# Patient Record
Sex: Female | Born: 1995
Health system: Southern US, Community
[De-identification: ages and names within clinical notes are randomized; demographics above are authoritative.]

## PROBLEM LIST (undated history)

## (undated) DIAGNOSIS — F329 Major depressive disorder, single episode, unspecified: Secondary | ICD-10-CM

## (undated) DIAGNOSIS — Z789 Other specified health status: Secondary | ICD-10-CM

## (undated) DIAGNOSIS — F419 Anxiety disorder, unspecified: Secondary | ICD-10-CM

## (undated) DIAGNOSIS — F32A Depression, unspecified: Secondary | ICD-10-CM

## (undated) HISTORY — PX: ANKLE SURGERY: SHX546

## (undated) HISTORY — PX: ANKLE FRACTURE SURGERY: SHX122

## (undated) HISTORY — PX: PILONIDAL CYST EXCISION: SHX744

---
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
## 2017-08-27 ENCOUNTER — Emergency Department (HOSPITAL_COMMUNITY)
Admission: EM | Admit: 2017-08-27 | Discharge: 2017-08-27 | Disposition: A | Discharge status: Home / Self Care | Payer: BLUE CROSS/BLUE SHIELD | Attending: Emergency Medicine | Admitting: Emergency Medicine

## 2017-08-27 ENCOUNTER — Encounter (HOSPITAL_COMMUNITY): Payer: Self-pay | Admitting: Emergency Medicine

## 2017-08-27 DIAGNOSIS — N760 Acute vaginitis: Secondary | ICD-10-CM | POA: Insufficient documentation

## 2017-08-27 DIAGNOSIS — Z206 Contact with and (suspected) exposure to human immunodeficiency virus [HIV]: Secondary | ICD-10-CM

## 2017-08-27 DIAGNOSIS — B9689 Other specified bacterial agents as the cause of diseases classified elsewhere: Secondary | ICD-10-CM

## 2017-08-27 LAB — COMPREHENSIVE METABOLIC PANEL
ALBUMIN: 4.3 g/dL (ref 3.5–5.0)
ALT: 12 U/L — ABNORMAL LOW (ref 14–54)
AST: 16 U/L (ref 15–41)
Alkaline Phosphatase: 74 U/L (ref 38–126)
Anion gap: 10 (ref 5–15)
BILIRUBIN TOTAL: 0.7 mg/dL (ref 0.3–1.2)
BUN: 10 mg/dL (ref 6–20)
CO2: 23 mmol/L (ref 22–32)
Calcium: 9.5 mg/dL (ref 8.9–10.3)
Chloride: 107 mmol/L (ref 101–111)
Creatinine, Ser: 0.69 mg/dL (ref 0.44–1.00)
GFR calc Af Amer: >60 mL/min (ref >60)
GLUCOSE: 92 mg/dL (ref 65–99)
POTASSIUM: 3.8 mmol/L (ref 3.5–5.1)
Sodium: 140 mmol/L (ref 135–145)
TOTAL PROTEIN: 8.6 g/dL — AB (ref 6.5–8.1)

## 2017-08-27 LAB — POC URINE PREG, ED: Preg Test, Ur: NEGATIVE

## 2017-08-27 LAB — WET PREP, GENITAL
Sperm: NONE SEEN
Trich, Wet Prep: NONE SEEN
YEAST WET PREP: NONE SEEN

## 2017-08-27 LAB — RAPID HIV SCREEN (HIV 1/2 AB+AG)
HIV 1/2 ANTIBODIES: NONREACTIVE
HIV-1 P24 Antigen - HIV24: NONREACTIVE

## 2017-08-27 MED ORDER — ELVITEG-COBIC-EMTRICIT-TENOFAF 150-150-200-10 MG PO TABS: 1.0000 | ORAL_TABLET | Freq: Every day | ORAL | 0 refills | Status: DC

## 2017-08-27 MED ORDER — ELVITEG-COBIC-EMTRICIT-TENOFAF 150-150-200-10 MG PO TABS: 1.0000 | ORAL_TABLET | Freq: Every day | ORAL | 0 refills | Status: AC

## 2017-08-27 MED ORDER — METRONIDAZOLE 500 MG PO TABS: 500.0000 mg | ORAL_TABLET | Freq: Two times a day (BID) | ORAL | 0 refills | Status: AC

## 2017-08-27 MED FILL — GENVOYA TABLET: 150-150-200 | 28 days supply | Qty: 28 | Fill #0

## 2017-08-27 MED FILL — metroNIDAZOLE 500 MG TABS: 500 | 7 days supply | Qty: 14 | Fill #0

## 2017-08-27 NOTE — ED Notes (Signed)
Non-reactive HIV results.

## 2017-08-27 NOTE — Care Management Note (Signed)
Case Management Note  CM consulted for the [facility] [employer] Program for assistance with [facility] co-pay cost.  CM completed enrollment for with pt and Dr. Adriana Simas.  Form faxed to [facility] and confirmed receipt.  Pt also has contact information for further questions.  Pt to pick up 5 day dose of prescription at the [facility] pharmacy and wait to hear from [employer] before picking up the next 23 doses.  No further CM needs noted at this time.

## 2017-08-27 NOTE — ED Triage Notes (Signed)
Pt reports that she was told that she been sexually active with someone that has HIV. Denies any complaints at this time. Just wants "check Up".

## 2017-08-27 NOTE — Discharge Instructions (Addendum)
Prescription for antiviral medicine and antibiotic for bacterial vaginosis.  [facility] or [facility] department.  Case management will contact you regarding follow-up labs.

## 2017-08-28 LAB — HIV ANTIBODY (ROUTINE TESTING W REFLEX): HIV Screen 4th Generation wRfx: NONREACTIVE

## 2017-08-28 LAB — HEPATITIS C ANTIBODY

## 2017-08-28 LAB — RPR: RPR Ser Ql: NONREACTIVE

## 2017-08-28 LAB — HEPATITIS B SURFACE ANTIGEN: Hepatitis B Surface Ag: NEGATIVE

## 2017-08-31 LAB — GC/CHLAMYDIA PROBE AMP (~~LOC~~) NOT AT ARMC
CHLAMYDIA, DNA PROBE: POSITIVE — AB
NEISSERIA GONORRHEA: NEGATIVE

## 2017-09-01 NOTE — ED Provider Notes (Addendum)
[facility]-EMERGENCY DEPT Provider Note   CSN: [id number] Arrival date & time: 08/27/17  0930     History   Chief Complaint Chief Complaint  Patient presents with  . Exposure to STD    HPI Brittany Martinez is a 22 y.o. female.  Patient reports being sexually active with an HIV-positive individual.  She had 2 exposures in the past week.  She wants to be "checked out".  No specific complaints of vaginal discharge, vaginal bleeding, concern for pregnancy.  She is normally healthy.     History reviewed. No pertinent past medical history.  There are no active problems to display for this patient.   Past Surgical History:  Procedure Laterality Date  . ANKLE FRACTURE SURGERY    . PILONIDAL CYST EXCISION       OB History   None      Home Medications    Prior to Admission medications   Medication Sig Start Date End Date Taking? Authorizing Provider  elvitegravir-cobicistat-emtricitabine-tenofovir (GENVOYA) 150-150-200-10 MG TABS tablet Take 1 tablet by mouth daily with breakfast. 08/27/17   Donnetta Hutching, MD  elvitegravir-cobicistat-emtricitabine-tenofovir (GENVOYA) 150-150-200-10 MG TABS tablet Take 1 tablet by mouth daily with breakfast. 08/27/17   Donnetta Hutching, MD  metroNIDAZOLE (FLAGYL) 500 MG tablet Take 1 tablet (500 mg total) by mouth 2 (two) times daily. 08/27/17   Donnetta Hutching, MD    Family History No family history on file.  Social History Social History   Tobacco Use  . Smoking status: Never Smoker  . Smokeless tobacco: Never Used  Substance Use Topics  . Alcohol use: No  . Drug use: No     Allergies   Patient has no known allergies.   Review of Systems Review of Systems  All other systems reviewed and are negative.    Physical Exam Updated Vital Signs BP (!) 141/91 (BP Location: Right Arm)   Pulse 71   Temp 98.9 F (37.2 C) (Oral)   Resp 14   Ht  (1.803 m)   Wt 108.9 kg (240 lb)   LMP 08/17/2017   SpO2 100%    BMI 33.47 kg/m   Physical Exam  Constitutional: She is oriented to person, place, and time. She appears well-developed and well-nourished.  HENT:  Head: Normocephalic and atraumatic.  Eyes: Conjunctivae are normal.  Neck: Neck supple.  Cardiovascular: Normal rate and regular rhythm.  Pulmonary/Chest: Effort normal and breath sounds normal.  Abdominal: Soft. Bowel sounds are normal.  Genitourinary:  Genitourinary Comments: Genital exam: Normal external genitalia.  Cervix/adnexa nontender.  White paracervical discharge.  Musculoskeletal: Normal range of motion.  Neurological: She is alert and oriented to person, place, and time.  Skin: Skin is warm and dry.  Psychiatric: She has a normal mood and affect. Her behavior is normal.  Nursing note and vitals reviewed.    ED Treatments / Results  Labs (all labs ordered are listed, but only abnormal results are displayed) Labs Reviewed  WET PREP, GENITAL - Abnormal; Notable for the following components:      Result Value   Clue Cells Wet Prep HPF POC PRESENT (*)    WBC, Wet Prep HPF POC MANY (*)    All other components within normal limits  COMPREHENSIVE METABOLIC PANEL - Abnormal; Notable for the following components:   Total Protein 8.6 (*)    ALT 12 (*)    All other components within normal limits  GC/CHLAMYDIA PROBE AMP ([place]) NOT AT [facility] - Abnormal; Notable  for the following components:   Chlamydia **POSITIVE** (*)    All other components within normal limits  HIV ANTIBODY (ROUTINE TESTING)  RAPID HIV SCREEN (HIV 1/2 AB+AG)  HEPATITIS C ANTIBODY  HEPATITIS B SURFACE ANTIGEN  RPR  POC URINE PREG, ED    EKG None  Radiology No results found.  Procedures Procedures (including critical care time)  Medications Ordered in ED Medications - No data to display   Initial Impression / Assessment and Plan / ED Course  I have reviewed the triage vital signs and the nursing notes.  Pertinent labs & imaging results  that were available during my care of the patient were reviewed by me and considered in my medical decision making (see chart for details).     Patient presents with a possible HIV exposure.  I attempted to follow the post exposure protocol including physical exam, appropriate lab tests, social work screening for follow-up labs.  This was all discussed with the patient and her significant other.  Will Rx antivirals for HIV and metronidazole for bacterial vaginosis.  Final Clinical Impressions(s) / ED Diagnoses   Final diagnoses:  HIV exposure  Bacterial vaginosis    ED Discharge Orders        Ordered    elvitegravir-cobicistat-emtricitabine-tenofovir (GENVOYA) 150-150-200-10 MG TABS tablet  Daily with breakfast     08/27/17 1328    elvitegravir-cobicistat-emtricitabine-tenofovir (GENVOYA) 150-150-200-10 MG TABS tablet  Daily with breakfast     08/27/17 1328    metroNIDAZOLE (FLAGYL) 500 MG tablet  2 times daily     08/27/17 1515       Donnetta Hutching, MD 09/01/17 1306    Donnetta Hutching, MD 09/01/17 1306

## 2017-09-13 ENCOUNTER — Telehealth: Payer: Self-pay | Admitting: Pharmacist Clinician (PhC)/ Clinical Pharmacy Specialist

## 2017-09-13 NOTE — Telephone Encounter (Signed)
Brittany Martinez was recently seen in the ED on 5/24 for HIV exposure. HIV PEP was started on Genvoya. Need PEP>>>PrEP transition once she finishes PEP.

## 2017-09-20 ENCOUNTER — Telehealth: Payer: Self-pay | Admitting: Pharmacist Clinician (PhC)/ Clinical Pharmacy Specialist

## 2017-09-20 NOTE — Telephone Encounter (Signed)
Brittany Martinez was recently seen in the ED for a possible HIV positive partner. Apparently, her partner was recently tested positive through the health department? The story is a bit confusing. He has not heard back from the health department yet. ED started her on Genvoya for PEP. She was also tested positive for chlamydia in the ED and [facility] [place]. Schedule her to come in to see pharmacy next week to do PrEP. Hopefully, we can get more of the story.

## 2017-09-27 ENCOUNTER — Ambulatory Visit: Payer: BLUE CROSS/BLUE SHIELD

## 2017-11-29 ENCOUNTER — Emergency Department (HOSPITAL_COMMUNITY)
Admission: EM | Admit: 2017-11-29 | Discharge: 2017-11-29 | Disposition: A | Discharge status: Home / Self Care | Payer: BLUE CROSS/BLUE SHIELD | Attending: Emergency Medicine | Admitting: Emergency Medicine

## 2017-11-29 ENCOUNTER — Other Ambulatory Visit: Payer: Self-pay

## 2017-11-29 ENCOUNTER — Encounter (HOSPITAL_COMMUNITY): Payer: Self-pay

## 2017-11-29 DIAGNOSIS — Z202 Contact with and (suspected) exposure to infections with a predominantly sexual mode of transmission: Secondary | ICD-10-CM

## 2017-11-29 LAB — RAPID HIV SCREEN (HIV 1/2 AB+AG)
HIV 1/2 Antibodies: NONREACTIVE
HIV-1 P24 Antigen - HIV24: NONREACTIVE

## 2017-11-29 MED ORDER — ELVITEG-COBIC-EMTRICIT-TENOFAF 150-150-200-10 MG PO TABS: 1.0000 | ORAL_TABLET | Freq: Every day | ORAL | 0 refills | Status: AC

## 2017-11-29 NOTE — ED Provider Notes (Signed)
[facility]-EMERGENCY DEPT Provider Note   CSN: [id number] Arrival date & time: 11/29/17  1943     History   Chief Complaint No chief complaint on file.   HPI Brittany Martinez is a 22 y.o. female who presents to the ED for STD check. Patient reports she has been sexually active with a partner that has tested positive for HIV. Patient reports she was here 09/03/17 and thought then her partner may be HIV positive but his test came back negative and she stopped the HIV medication. Now patient reports that her partner was retested 11/09/17 and was HIV positive. Patient here requesting HIV screen. She states she can get all the other STD testing [facility]. Patient request starting back on medication since she had unprotected sex with the partner 3 day ago. Patient denies diarrhea, night sweats, weight loss or other symptoms.  HPI  History reviewed. No pertinent past medical history.  There are no active problems to display for this patient.   Past Surgical History:  Procedure Laterality Date  . ANKLE FRACTURE SURGERY    . PILONIDAL CYST EXCISION       OB History   None      Home Medications    Prior to Admission medications   Medication Sig Start Date End Date Taking? Authorizing Provider  elvitegravir-cobicistat-emtricitabine-tenofovir (GENVOYA) 150-150-200-10 MG TABS tablet Take 1 tablet by mouth daily with breakfast. 08/27/17   Donnetta Hutchingook, Brian, MD  elvitegravir-cobicistat-emtricitabine-tenofovir (GENVOYA) 150-150-200-10 MG TABS tablet Take 1 tablet by mouth daily with breakfast. 08/27/17   Donnetta Hutchingook, Brian, MD  metroNIDAZOLE (FLAGYL) 500 MG tablet Take 1 tablet (500 mg total) by mouth 2 (two) times daily. 08/27/17   Donnetta Hutchingook, Brian, MD    Family History No family history on file.  Social History Social History   Tobacco Use  . Smoking status: Never Smoker  . Smokeless tobacco: Never Used  Substance Use Topics  . Alcohol use: No  . Drug use: No      Allergies   Patient has no known allergies.   Review of Systems Review of Systems  All other systems reviewed and are negative. patient denies any symptoms  Physical Exam Updated Vital Signs BP 123/78 (BP Location: Left Arm)   Pulse 80   Temp 98.5 F (36.9 C) (Oral)   Resp 18   Ht 5\' 9"  (1.753 m)   Wt 97.1 kg   LMP 11/04/2017   SpO2 100%   BMI 31.60 kg/m   Physical Exam  Constitutional: She appears well-developed and well-nourished. No distress.  HENT:  Head: Normocephalic.  Eyes: EOM are normal.  Neck: Neck supple.  Cardiovascular: Normal rate.  Pulmonary/Chest: Effort normal.  Musculoskeletal: Normal range of motion.  Neurological: She is alert.  Skin: Skin is warm and dry.  Psychiatric: She has a normal mood and affect.  Nursing note and vitals reviewed.  I discussed this case with DR. Silverio LayYao.   ED Treatments / Results  Labs (all labs ordered are listed, but only abnormal results are displayed) Labs Reviewed  HIV ANTIBODY (ROUTINE TESTING)  RAPID HIV SCREEN (HIV 1/2 AB+AG)   Radiology No results found.  Procedures Procedures (including critical care time)  Medications Ordered in ED Medications - No data to display  Plan: if  HIV positive will consult ID and start Genvoya. If HIV negative start Genvoya and have patient follow up with her PCP or [facility] for continued screening.   Initial Impression / Assessment and Plan /  ED Course  I have reviewed the triage vital signs and the nursing notes. Care turned over to Shirlyn Goltz, [facility] @ 10:40 pm. Patient awaiting results Final Clinical Impressions(s) / ED Diagnoses   Final diagnoses:  STD exposure    ED Discharge Orders    None       Kerrie [place], NP 11/29/17 2242    Charlynne Pander, MD 11/29/17 [phone]

## 2017-11-29 NOTE — ED Triage Notes (Signed)
Pt is escorted with mother Pt wants to be tested for HIV, and states that her partner was tested and was positive. Pt denies any pain or symptoms.

## 2017-11-29 NOTE — Discharge Instructions (Signed)
Your HIV test was negative today.  This is not always accurate at the beginning of an HIV infection, so we would like you to start taking antiviral medication just incase this is an early infection.   Please take Genvoya daily.  Follow up with your regular doctor for recheck next week for further testing and to determine if you can be taken off the Genvoya.

## 2017-11-30 LAB — HIV ANTIBODY (ROUTINE TESTING W REFLEX): HIV Screen 4th Generation wRfx: NONREACTIVE

## 2018-09-03 ENCOUNTER — Encounter (HOSPITAL_COMMUNITY): Payer: Self-pay

## 2018-09-03 ENCOUNTER — Other Ambulatory Visit: Payer: Self-pay

## 2018-09-03 ENCOUNTER — Inpatient Hospital Stay (HOSPITAL_COMMUNITY)
Admission: AD | Admit: 2018-09-03 | Discharge: 2018-09-03 | Disposition: A | Discharge status: Home / Self Care | Payer: BLUE CROSS/BLUE SHIELD | Source: Ambulatory Visit | Attending: Obstetrics | Admitting: Obstetrics

## 2018-09-03 ENCOUNTER — Inpatient Hospital Stay (HOSPITAL_COMMUNITY): Payer: BLUE CROSS/BLUE SHIELD

## 2018-09-03 DIAGNOSIS — O039 Complete or unspecified spontaneous abortion without complication: Secondary | ICD-10-CM | POA: Diagnosis not present

## 2018-09-03 DIAGNOSIS — Z3A13 13 weeks gestation of pregnancy: Secondary | ICD-10-CM | POA: Diagnosis not present

## 2018-09-03 DIAGNOSIS — O26891 Other specified pregnancy related conditions, first trimester: Secondary | ICD-10-CM | POA: Diagnosis not present

## 2018-09-03 DIAGNOSIS — R109 Unspecified abdominal pain: Secondary | ICD-10-CM | POA: Diagnosis not present

## 2018-09-03 DIAGNOSIS — O209 Hemorrhage in early pregnancy, unspecified: Secondary | ICD-10-CM

## 2018-09-03 DIAGNOSIS — Z6790 Unspecified blood type, Rh positive: Secondary | ICD-10-CM

## 2018-09-03 HISTORY — DX: Other specified health status: Z78.9

## 2018-09-03 LAB — CBC WITH DIFFERENTIAL/PLATELET
Abs Immature Granulocytes: 0.02 10*3/uL (ref 0.00–0.07)
Basophils Absolute: 0 10*3/uL (ref 0.0–0.1)
Basophils Relative: 0 %
Eosinophils Absolute: 0.2 10*3/uL (ref 0.0–0.5)
Eosinophils Relative: 2 %
HCT: 35.4 % — ABNORMAL LOW (ref 36.0–46.0)
Hemoglobin: 11.9 g/dL — ABNORMAL LOW (ref 12.0–15.0)
Immature Granulocytes: 0 %
Lymphocytes Relative: 36 %
Lymphs Abs: 3.5 10*3/uL (ref 0.7–4.0)
MCH: 28.1 pg (ref 26.0–34.0)
MCHC: 33.6 g/dL (ref 30.0–36.0)
MCV: 83.5 fL (ref 80.0–100.0)
Monocytes Absolute: 0.6 10*3/uL (ref 0.1–1.0)
Monocytes Relative: 6 %
Neutro Abs: 5.3 10*3/uL (ref 1.7–7.7)
Neutrophils Relative %: 56 %
Platelets: 219 10*3/uL (ref 150–400)
RBC: 4.24 MIL/uL (ref 3.87–5.11)
RDW: 15.1 % (ref 11.5–15.5)
WBC: 9.7 10*3/uL (ref 4.0–10.5)
nRBC: 0 % (ref 0.0–0.2)

## 2018-09-03 LAB — URINALYSIS, ROUTINE W REFLEX MICROSCOPIC
Bacteria, UA: NONE SEEN
Bilirubin Urine: NEGATIVE
Glucose, UA: NEGATIVE mg/dL
Hgb urine dipstick: NEGATIVE
Ketones, ur: NEGATIVE mg/dL
Leukocytes,Ua: NEGATIVE
Nitrite: NEGATIVE
Protein, ur: 30 mg/dL — AB
Specific Gravity, Urine: 1.021 (ref 1.005–1.030)
pH: 6 (ref 5.0–8.0)

## 2018-09-03 LAB — RH IG WORKUP (INCLUDES ABO/RH)
ABO/RH(D): A POS
Antibody Screen: NEGATIVE
Gestational Age(Wks): 13

## 2018-09-03 MED ORDER — OXYCODONE-ACETAMINOPHEN 5-325 MG PO TABS: 1.0000 | ORAL_TABLET | Freq: Four times a day (QID) | ORAL | 0 refills | Status: AC | PRN

## 2018-09-03 MED ORDER — MISOPROSTOL 200 MCG PO TABS: 800.0000 ug | ORAL_TABLET | Freq: Once | ORAL | 1 refills | Status: AC

## 2018-09-03 MED ORDER — OXYCODONE-ACETAMINOPHEN 5-325 MG PO TABS
1.0000 | ORAL_TABLET | Freq: Once | ORAL | Status: AC
  Administered 2018-09-03: 1 via ORAL
  Filled 2018-09-03: qty 1

## 2018-09-03 MED ORDER — KETOROLAC TROMETHAMINE 60 MG/2ML IM SOLN
60.0000 mg | Freq: Once | INTRAMUSCULAR | Status: AC
  Administered 2018-09-03: 60 mg via INTRAMUSCULAR
  Filled 2018-09-03: qty 2

## 2018-09-03 MED ORDER — IBUPROFEN 600 MG PO TABS: 600.0000 mg | ORAL_TABLET | Freq: Four times a day (QID) | ORAL | 0 refills | Status: DC | PRN

## 2018-09-03 NOTE — MAU Provider Note (Signed)
History     CSN: [id number]  Arrival date and time: 09/03/18 1357   First Provider Initiated Contact with Patient 09/03/18 1442      Chief Complaint  Patient presents with  . Abdominal Pain  . Vaginal Bleeding   HPI   Ms. Brittany Martinez is a 23 y.o. female G1P0 @ [redacted]w[redacted]d here with vaginal bleeding and abdominal pain. The cramping started 1 week ago. The bleeding started yesterday and today she passed several clots. The pain and bleeding has gotten worse over the last 24 hours. She just had her genetic testing done in the office and was told the pregnancy was low risk.   NIPS normal low risk 5/18 @ [redacted]w[redacted]d  History of severe depression with history of suicidal ideation in the past.    OB History    Gravida  1   Para      Term      Preterm      AB      Living        SAB      TAB      Ectopic      Multiple      Live Births              Past Medical History:  Diagnosis Date  . Medical history non-contributory     Past Surgical History:  Procedure Laterality Date  . ANKLE FRACTURE SURGERY    . PILONIDAL CYST EXCISION      History reviewed. No pertinent family history.  Social History   Tobacco Use  . Smoking status: Never Smoker  . Smokeless tobacco: Never Used  Substance Use Topics  . Alcohol use: No  . Drug use: No    Allergies: No Known Allergies  Medications Prior to Admission  Medication Sig Dispense Refill Last Dose  . elvitegravir-cobicistat-emtricitabine-tenofovir (GENVOYA) 150-150-200-10 MG TABS tablet Take 1 tablet by mouth daily with breakfast. 5 tablet 0   . elvitegravir-cobicistat-emtricitabine-tenofovir (GENVOYA) 150-150-200-10 MG TABS tablet Take 1 tablet by mouth daily with breakfast. 30 tablet 0   . metroNIDAZOLE (FLAGYL) 500 MG tablet Take 1 tablet (500 mg total) by mouth 2 (two) times daily. 14 tablet 0    Results for orders placed or performed during the hospital encounter of 09/03/18 (from the past 48 hour(s))   Urinalysis, Routine w reflex microscopic     Status: Abnormal   Collection Time: 09/03/18  2:12 PM  Result Value Ref Range   Color, Urine YELLOW YELLOW   APPearance CLEAR CLEAR   Specific Gravity, Urine 1.021 1.005 - 1.030   pH 6.0 5.0 - 8.0   Glucose, UA NEGATIVE NEGATIVE mg/dL   Hgb urine dipstick NEGATIVE NEGATIVE   Bilirubin Urine NEGATIVE NEGATIVE   Ketones, ur NEGATIVE NEGATIVE mg/dL   Protein, ur 30 (A) NEGATIVE mg/dL   Nitrite NEGATIVE NEGATIVE   Leukocytes,Ua NEGATIVE NEGATIVE   RBC / HPF 0-5 0 - 5 RBC/hpf   WBC, UA 0-5 0 - 5 WBC/hpf   Bacteria, UA NONE SEEN NONE SEEN   Squamous Epithelial / LPF 0-5 0 - 5   Mucus PRESENT     Comment: Performed at [facility], [address], [place], [place] [address]  Rh IG workup (includes ABO/Rh)     Status: None   Collection Time: 09/03/18  3:47 PM  Result Value Ref Range   Gestational Age(Wks) 13    ABO/RH(D) A POS    No rh immune globuloin NOT A RH  IMMUNE GLOBULIN CANDIDATE, PT RH POSITIVE    Antibody Screen NEG   CBC with Differential/Platelet     Status: Abnormal   Collection Time: 09/03/18  3:47 PM  Result Value Ref Range   WBC 9.7 4.0 - 10.5 K/uL   RBC 4.24 3.87 - 5.11 MIL/uL   Hemoglobin 11.9 (L) 12.0 - 15.0 g/dL   HCT 69.6 (L) 29.5 - 28.4 %   MCV 83.5 80.0 - 100.0 fL   MCH 28.1 26.0 - 34.0 pg   MCHC 33.6 30.0 - 36.0 g/dL   RDW 13.2 44.0 - 10.2 %   Platelets 219 150 - 400 K/uL   nRBC 0.0 0.0 - 0.2 %   Neutrophils Relative % 56 %   Neutro Abs 5.3 1.7 - 7.7 K/uL   Lymphocytes Relative 36 %   Lymphs Abs 3.5 0.7 - 4.0 K/uL   Monocytes Relative 6 %   Monocytes Absolute 0.6 0.1 - 1.0 K/uL   Eosinophils Relative 2 %   Eosinophils Absolute 0.2 0.0 - 0.5 K/uL   Basophils Relative 0 %   Basophils Absolute 0.0 0.0 - 0.1 K/uL   Immature Granulocytes 0 %   Abs Immature Granulocytes 0.02 0.00 - 0.07 K/uL    Comment: Performed at [facility], [address], [place], [place] [address]   Review of Systems   Gastrointestinal: Positive for abdominal pain.  Genitourinary: Positive for vaginal bleeding.   Physical Exam   Blood pressure 137/74, pulse 94, temperature 98.1 F (36.7 C), temperature source Oral, resp. rate 16, last menstrual period 06/02/2018, SpO2 100 %.  Physical Exam  Constitutional: She is oriented to person, place, and time. She appears well-developed and well-nourished. No distress.  HENT:  Head: Normocephalic.  Eyes: Pupils are equal, round, and reactive to light.  GI: Soft. She exhibits no distension. There is no abdominal tenderness. There is no rebound.  Genitourinary:    Genitourinary Comments: Cervix: closed, firm, anterior. Scant brown blood noted on exam glove.    Musculoskeletal: Normal range of motion.  Neurological: She is alert and oriented to person, place, and time.  Skin: Skin is warm. She is not diaphoretic.  Psychiatric: Her behavior is normal.    MAU Course  Procedures   Pt informed that the ultrasound is considered a limited OB ultrasound and is not intended to be a complete ultrasound exam.  Patient also informed that the ultrasound is not being completed with the intent of assessing for fetal or placental anomalies or any pelvic abnormalities.  Explained that the purpose of today's ultrasound is to assess for  viability.  Patient acknowledges the purpose of the exam and the limitations of the study.     MDM  Unable to doppler fetal heart tones Bedside US without fetal heart tones [place] to come to bedside for [place] ABO/CBC ordered  Discussed patient with Dr. Chestine Spore over the phone. Records in the office reviewed by Dr. Chestine Spore.  Cytotec ordered 800 mcg buccal. Patient to follow up in the office this week.  A positive blood type.  Assessment and Plan   A:  1. SAB (spontaneous abortion)   2. [redacted] weeks gestation of pregnancy   3. Vaginal bleeding in pregnancy, first trimester   4. Blood type, Rh positive     P:  Discharge home in stable  condition Rx: Cytotec, Percocet, ibuprofen Return to MAU if symptoms worsen F/U with the office later this week. Call the office first thing Monday morning.  Support given  Patient left the hospital with her mother.    Duane Lopeasch, Jennifer I, NP 09/05/2018 11:59 AM   Venia CarbonJennifer Rasch 09/03/2018, 4:38 PM

## 2018-09-03 NOTE — MAU Note (Signed)
Brittany Martinez is a 23 y.o. at [redacted]w[redacted]d here in MAU reporting: cramping since Sunday, has some spotting starting yesterday. Yesterday was having nausea, vomiting, and diarrhea which subsided since, states today when she was having a BM she passed approximately 3 golfball sized clots.  Onset of complaint: ongoing  Pain score: 8/10  Vitals:   09/03/18 1419  BP: 137/74  Pulse: 94  Resp: 16  Temp: 98.1 F (36.7 C)  SpO2: 100%      Lab orders placed from triage: UA, UPT

## 2018-09-03 NOTE — Discharge Instructions (Signed)
Miscarriage  A miscarriage is the loss of an unborn baby (fetus) before the 20th week of pregnancy.  Follow these instructions at home:  Medicines    · Take over-the-counter and prescription medicines only as told by your doctor.  · If you were prescribed antibiotic medicine, take it as told by your doctor. Do not stop taking the antibiotic even if you start to feel better.  · Do not take NSAIDs unless your doctor says that this is safe for you. NSAIDs include aspirin and ibuprofen. These medicines can cause bleeding.  Activity  · Rest as directed. Ask your doctor what activities are safe for you.  · Have someone help you at home during this time.  General instructions  · Write down how many pads you use each day and how soaked they are.  · Watch the amount of tissue or clumps of blood (blood clots) that you pass from your vagina. Save any large amounts of tissue for your doctor.  · Do not use tampons, douche, or have sex until your doctor approves.  · To help you and your partner with the process of grieving, talk with your doctor or seek counseling.  · When you are ready, meet with your doctor to talk about steps you should take for your health. Also, talk with your doctor about steps to take to have a healthy pregnancy in the future.  · Keep all follow-up visits as told by your doctor. This is important.  Contact a doctor if:  · You have a fever or chills.  · You have vaginal discharge that smells bad.  · You have more bleeding.  Get help right away if:  · You have very bad cramps or pain in your back or belly.  · You pass clumps of blood that are walnut-sized or larger from your vagina.  · You pass tissue that is walnut-sized or larger from your vagina.  · You soak more than 1 regular pad in an hour.  · You get light-headed or weak.  · You faint (pass out).  · You have feelings of sadness that do not go away, or you have thoughts of hurting yourself.  Summary  · A miscarriage is the loss of an unborn baby before  the 20th week of pregnancy.  · Follow your doctor's instructions for home care. Keep all follow-up appointments.  · To help you and your partner with the process of grieving, talk with your doctor or seek counseling.  This information is not intended to replace advice given to you by your health care provider. Make sure you discuss any questions you have with your health care provider.  Document Released: 06/15/2011 Document Revised: 04/28/2016 Document Reviewed: 04/28/2016  Elsevier Interactive Patient Education © 2019 Elsevier Inc[employer]

## 2018-10-02 ENCOUNTER — Encounter (HOSPITAL_COMMUNITY): Payer: Self-pay | Admitting: *Deleted

## 2018-10-02 ENCOUNTER — Other Ambulatory Visit: Payer: Self-pay

## 2018-10-02 ENCOUNTER — Emergency Department (HOSPITAL_COMMUNITY)
Admission: EM | Admit: 2018-10-02 | Discharge: 2018-10-02 | Disposition: A | Discharge status: Home / Self Care | Payer: BC Managed Care – PPO | Attending: Emergency Medicine | Admitting: Emergency Medicine

## 2018-10-02 DIAGNOSIS — Z79899 Other long term (current) drug therapy: Secondary | ICD-10-CM | POA: Insufficient documentation

## 2018-10-02 DIAGNOSIS — F329 Major depressive disorder, single episode, unspecified: Secondary | ICD-10-CM | POA: Insufficient documentation

## 2018-10-02 DIAGNOSIS — Z046 Encounter for general psychiatric examination, requested by authority: Secondary | ICD-10-CM | POA: Diagnosis not present

## 2018-10-02 DIAGNOSIS — F32A Depression, unspecified: Secondary | ICD-10-CM

## 2018-10-02 HISTORY — DX: Anxiety disorder, unspecified: F41.9

## 2018-10-02 HISTORY — DX: Depression, unspecified: F32.A

## 2018-10-02 LAB — PREGNANCY, URINE: Preg Test, Ur: NEGATIVE
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
## 2018-10-02 NOTE — ED Notes (Signed)
Bed: [facility] Expected date:  Expected time:  Means of arrival:  Comments: 
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
## 2018-10-02 NOTE — ED Triage Notes (Signed)
Pt states this is her first visit to this hospital. She has history of Depression with 2 suicide attempts in the past. Hospitalized in [place] in March. Pt had a miscarriage Sep 03, 2018 and continues to be depressed. Tearful in triage.

## 2019-06-04 ENCOUNTER — Emergency Department (HOSPITAL_COMMUNITY)
Admission: EM | Admit: 2019-06-04 | Discharge: 2019-06-04 | Disposition: A | Discharge status: Home / Self Care | Payer: BC Managed Care – PPO | Attending: Emergency Medicine | Admitting: Emergency Medicine

## 2019-06-04 ENCOUNTER — Encounter (HOSPITAL_COMMUNITY): Payer: Self-pay

## 2019-06-04 ENCOUNTER — Other Ambulatory Visit: Payer: Self-pay

## 2019-06-04 DIAGNOSIS — Z79899 Other long term (current) drug therapy: Secondary | ICD-10-CM | POA: Insufficient documentation

## 2019-06-04 DIAGNOSIS — M62838 Other muscle spasm: Secondary | ICD-10-CM | POA: Insufficient documentation

## 2019-06-04 DIAGNOSIS — Z793 Long term (current) use of hormonal contraceptives: Secondary | ICD-10-CM | POA: Insufficient documentation

## 2019-06-04 DIAGNOSIS — M542 Cervicalgia: Secondary | ICD-10-CM | POA: Diagnosis present

## 2019-06-04 MED ORDER — METHOCARBAMOL 500 MG PO TABS: 500.0000 mg | ORAL_TABLET | Freq: Three times a day (TID) | ORAL | 0 refills | Status: AC | PRN

## 2019-06-04 MED ORDER — METHOCARBAMOL 500 MG PO TABS
500.0000 mg | ORAL_TABLET | Freq: Once | ORAL | Status: AC
  Administered 2019-06-04: 21:00:00 500 mg via ORAL
  Filled 2019-06-04: qty 1

## 2019-06-04 MED ORDER — IBUPROFEN 600 MG PO TABS: 600.0000 mg | ORAL_TABLET | Freq: Four times a day (QID) | ORAL | 0 refills | Status: AC | PRN

## 2019-06-04 MED ORDER — KETOROLAC TROMETHAMINE 30 MG/ML IJ SOLN
30.0000 mg | Freq: Once | INTRAMUSCULAR | Status: AC
  Administered 2019-06-04: 30 mg via INTRAMUSCULAR
  Filled 2019-06-04: qty 1

## 2019-06-04 NOTE — Discharge Instructions (Signed)
Keep yourself hydrated.  Take the anti-inflammatories as prescribed.  Do not take the muscle relaxers if you are working or driving.  Return to the ED if develop worsening symptoms including weakness, numbness, tingling, confusion or any other concerns

## 2019-06-04 NOTE — ED Triage Notes (Signed)
Pt reports neck and back pain. She states that her first day at [facility] was Friday and she did a lot of lifting and moving. The next day, she felt stiff and sore. States that naproxen doesn't work. She has also been using biofreeze without relief. Ambulatory.

## 2019-06-04 NOTE — ED Provider Notes (Signed)
[facility]-EMERGENCY DEPT Provider Note   CSN: [id number] Arrival date & time: 06/04/19  1944     History Chief Complaint  Patient presents with  . Neck Pain    Brittany Martinez is a 24 y.o. female.  Patient presents with pain to her neck, shoulders and back.  This started after she started working at [employer] 2 nights ago.  States she was lifting boxes up and 75 pounds)."  For about 6 hours.  She did not have any pain while she was working but the next day she had pain across her neck, shoulders and back.  She has been taking naproxen at home and Biofreeze.  She denies any focal numbness, tingling, bowel or bladder incontinence.  No headache.  No chest pain or shortness of breath.  Denies any sudden onset headache while she was working.  She states she came in tonight because she is feeling sore across her neck, shoulders and back and does not feel like she can go back to this job.  She thinks she did not have enough training and did not have enough help while she was working the other day.  She admits this job was more physical than what she was used to.  Denies any chronic medical conditions of any medication use.  States she has been hydrating well at home.  The history is provided by the patient.  Neck Pain Associated symptoms: no chest pain, no fever, no headaches and no weakness        Past Medical History:  Diagnosis Date  . Anxiety   . Depression   . Medical history non-contributory     There are no problems to display for this patient.   Past Surgical History:  Procedure Laterality Date  . ANKLE FRACTURE SURGERY    . ANKLE SURGERY    . PILONIDAL CYST EXCISION       OB History    Gravida  1   Para      Term      Preterm      AB      Living        SAB      TAB      Ectopic      Multiple      Live Births              History reviewed. No pertinent family history.  Social History   Tobacco Use  . Smoking status: Never  Smoker  . Smokeless tobacco: Never Used  Substance Use Topics  . Alcohol use: No  . Drug use: No    Home Medications Prior to Admission medications   Medication Sig Start Date End Date Taking? Authorizing Provider  elvitegravir-cobicistat-emtricitabine-tenofovir (GENVOYA) 150-150-200-10 MG TABS tablet Take 1 tablet by mouth daily with breakfast. 08/27/17   Donnetta Hutching, MD  elvitegravir-cobicistat-emtricitabine-tenofovir (GENVOYA) 150-150-200-10 MG TABS tablet Take 1 tablet by mouth daily with breakfast. Patient not taking: Reported on 10/02/2018 11/29/17   Kellie Shropshire, PA-C  ibuprofen (ADVIL) 200 MG tablet Take 400 mg by mouth every 6 (six) hours as needed for mild pain.    [provider]  ibuprofen (ADVIL) 600 MG tablet Take 1 tablet (600 mg total) by mouth every 6 (six) hours as needed for mild pain. Patient not taking: Reported on 10/02/2018 09/03/18   Rasch, Victorino Dike I, NP  metroNIDAZOLE (FLAGYL) 500 MG tablet Take 1 tablet (500 mg total) by mouth 2 (two) times daily. Patient not taking:  Reported on 10/02/2018 08/27/17   Nat Christen, MD  misoprostol (CYTOTEC) 200 MCG tablet Take 4 tablets (800 mcg total) by mouth once for 1 dose. Repeat the dose in 24 hours if no bleeding 09/03/18 09/03/18  Rasch, Anderson Malta I, NP  Norethindrone Acet-Ethinyl Est (JUNEL 1/20 PO) Take 1 tablet by mouth daily.    [provider]  oxyCODONE-acetaminophen (PERCOCET) 5-325 MG tablet Take 1-2 tablets by mouth every 6 (six) hours as needed for severe pain. Patient not taking: Reported on 10/02/2018 09/03/18 09/03/19  Rasch, Artist Pais, NP    Allergies    Prozac [fluoxetine hcl]  Review of Systems   Review of Systems  Constitutional: Positive for fatigue. Negative for activity change, appetite change and fever.  HENT: Negative for congestion and rhinorrhea.   Eyes: Negative for visual disturbance.  Respiratory: Negative for cough, chest tightness and shortness of breath.   Cardiovascular:  Negative for chest pain.  Gastrointestinal: Negative for abdominal pain, nausea and vomiting.  Genitourinary: Negative for dysuria and hematuria.  Musculoskeletal: Positive for arthralgias, back pain, myalgias and neck pain.  Skin: Negative for rash.  Neurological: Negative for dizziness, weakness, light-headedness and headaches.   all other systems are negative except as noted in the HPI and PMH.    Physical Exam Updated Vital Signs BP (!) 144/108 (BP Location: Left Arm)   Pulse 95   Temp 98 F (36.7 C) (Oral)   Resp 16   Ht 5\' 9"  (1.753 m)   Wt 103.9 kg   SpO2 100%   BMI 33.82 kg/m   Physical Exam Vitals and nursing note reviewed.  Constitutional:      General: She is not in acute distress.    Appearance: She is well-developed.  HENT:     Head: Normocephalic and atraumatic.     Mouth/Throat:     Pharynx: No oropharyngeal exudate.  Eyes:     Conjunctiva/sclera: Conjunctivae normal.     Pupils: Pupils are equal, round, and reactive to light.  Neck:     Comments: No meningismus. Paraspinal cervical tenderness and spasm across her trapezius Cardiovascular:     Rate and Rhythm: Normal rate and regular rhythm.     Heart sounds: Normal heart sounds. No murmur.  Pulmonary:     Effort: Pulmonary effort is normal. No respiratory distress.     Breath sounds: Normal breath sounds.  Abdominal:     Palpations: Abdomen is soft.     Tenderness: There is no abdominal tenderness. There is no guarding or rebound.  Musculoskeletal:        General: Normal range of motion.     Cervical back: Normal range of motion and neck supple. Tenderness present.     Comments: Spasms to her paraspinal thoracic musculature  5/5 strength in bilateral lower extremities. Ankle plantar and dorsiflexion intact. Great toe extension intact bilaterally. +2 DP and PT pulses. +2 patellar reflexes bilaterally. Normal gait.   Skin:    General: Skin is warm.     Capillary Refill: Capillary refill takes less  than 2 seconds.  Neurological:     General: No focal deficit present.     Mental Status: She is alert and oriented to person, place, and time. Mental status is at baseline.     Cranial Nerves: No cranial nerve deficit.     Motor: No abnormal muscle tone.     Coordination: Coordination normal.     Comments:  5/5 strength throughout. CN 2-12 intact.Equal grip strength.   Psychiatric:  Behavior: Behavior normal.     ED Results / Procedures / Treatments   Labs (all labs ordered are listed, but only abnormal results are displayed) Labs Reviewed - No data to display  EKG None  Radiology No results found.  Procedures Procedures (including critical care time)  Medications Ordered in ED Medications  ketorolac (TORADOL) 30 MG/ML injection 30 mg (has no administration in time range)  methocarbamol (ROBAXIN) tablet 500 mg (has no administration in time range)    ED Course  I have reviewed the triage vital signs and the nursing notes.  Pertinent labs & imaging results that were available during my care of the patient were reviewed by me and considered in my medical decision making (see chart for details).    MDM Rules/Calculators/A&P                     Neck and back pain after doing physical lifting 2 days ago.  No focal neurological deficits.  Low suspicion for cord compression or cauda equina.  Low suspicion for vertebral or carotid dissection.  We will give muscle relaxers and anti-inflammatories.  Discussed with patient that she cannot use the muscle relaxers if she is working or driving.  Note for work given. INcrease hydration, NSAIDs, rest. Limit lifting at work and keep hydrated while working. Return precautions discussed.   Final Clinical Impression(s) / ED Diagnoses Final diagnoses:  Muscle spasms of neck    Rx / DC Orders ED Discharge Orders    None       Christopher Glasscock, Jeannett Senior, MD 06/04/19 2352

## 2020-10-13 IMAGING — US OBSTETRIC <14 WK ULTRASOUND
1 series · 15 of 28 positions shown · non-contrast
Comparison: None.

CLINICAL DATA: Pregnant patient with bleeding.

EXAM:
OBSTETRIC <14 WK ULTRASOUND
TECHNIQUE: Transabdominal ultrasound was performed for evaluation of the
gestation as well as the maternal uterus and adnexal regions.

[Series 1: obstetric <14 wk ultrasound · 15 of 54 slices shown]
[im 1/54]
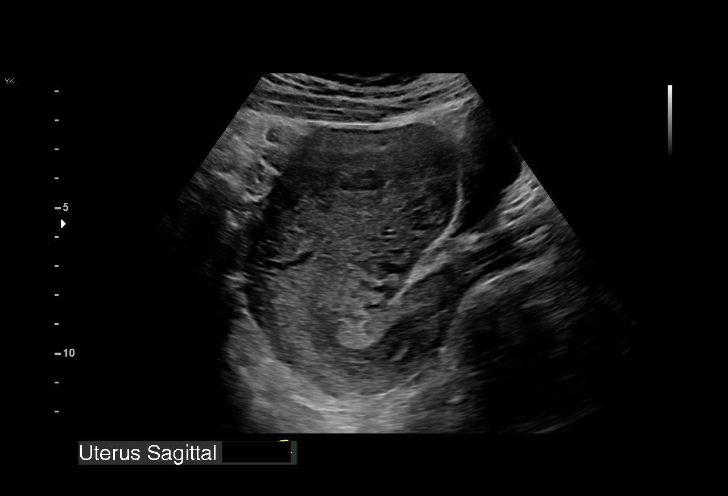
[im 4/54]
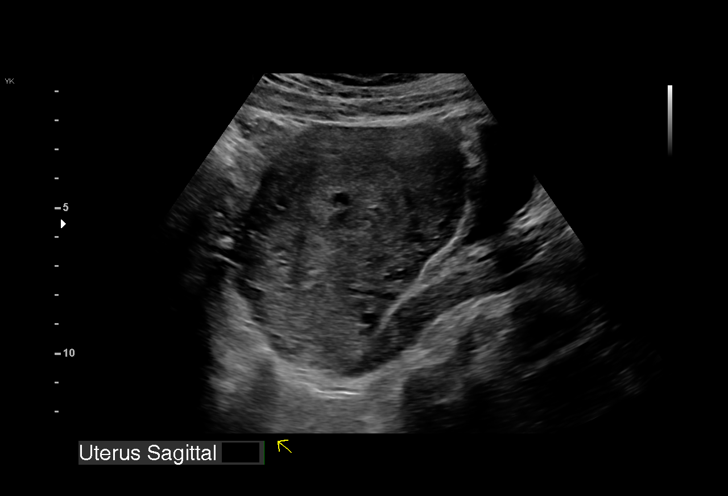
[im 8/54]
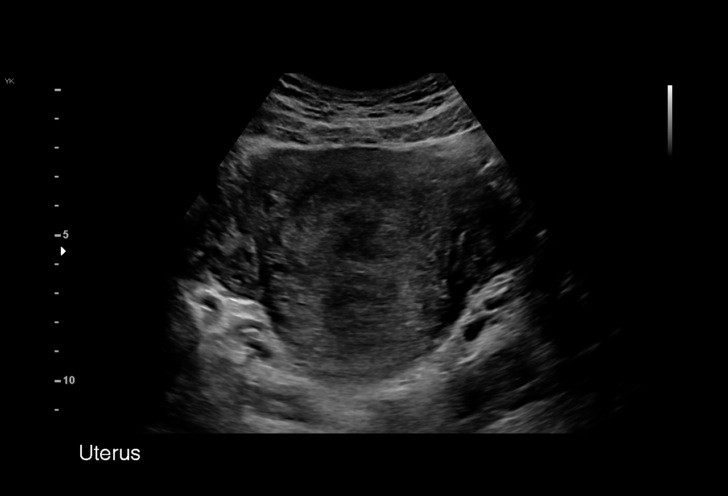
[im 12/54]
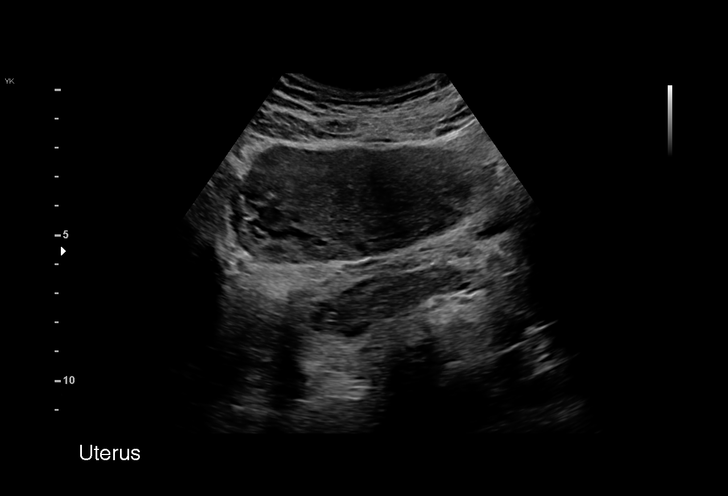
[im 16/54]
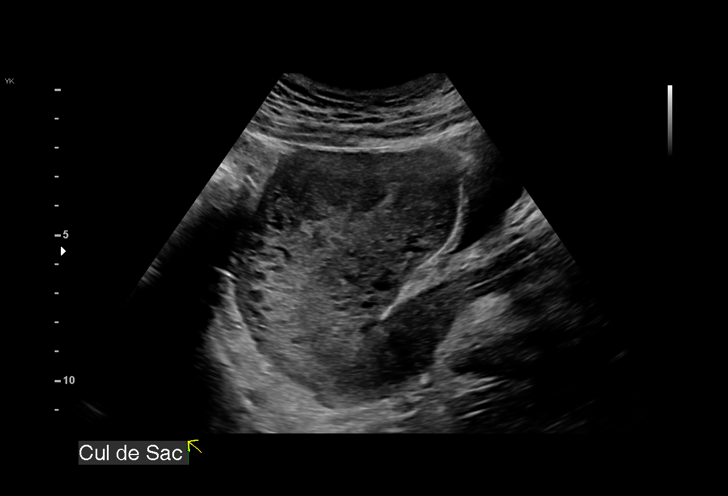
[im 20/54]
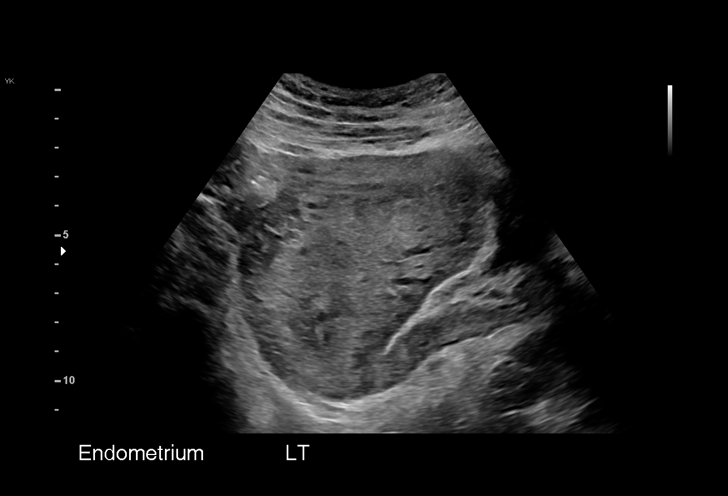
[im 24/54]
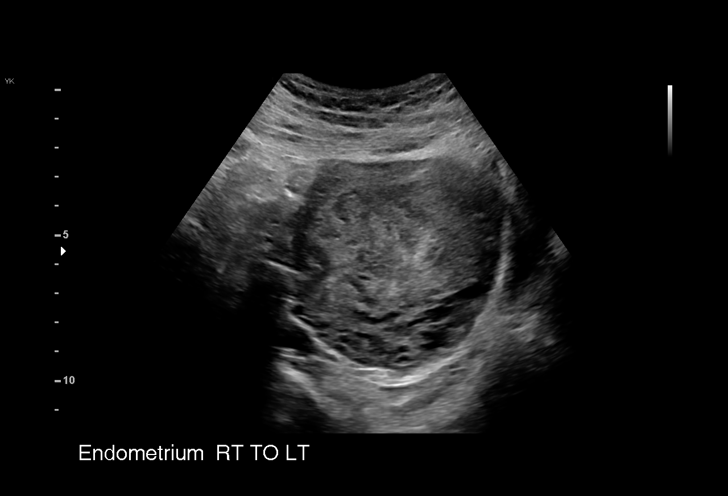
[im 28/54]
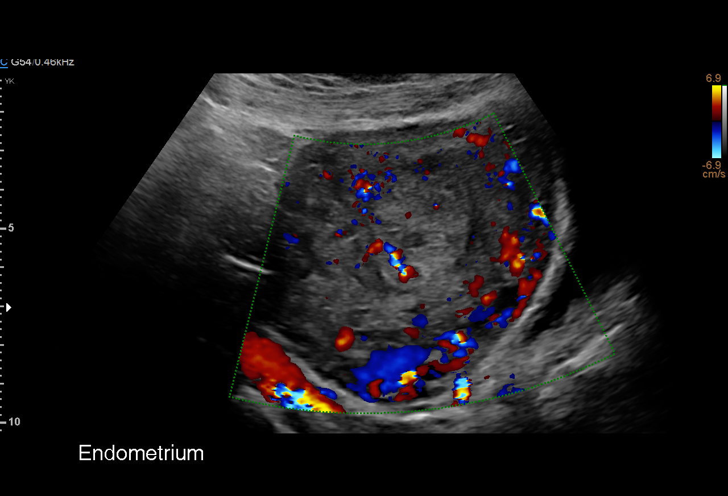
[im 30/54]
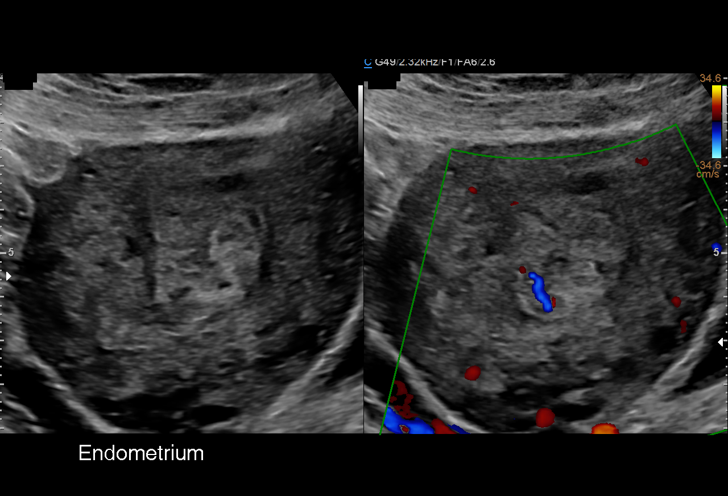
[im 34/54]
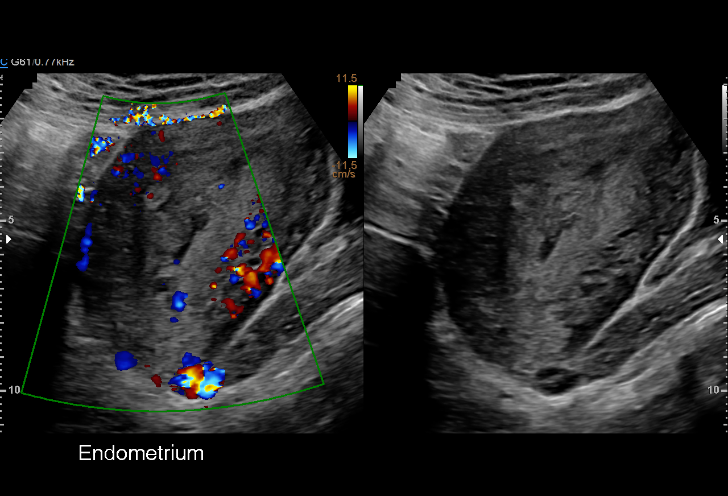
[im 38/54]
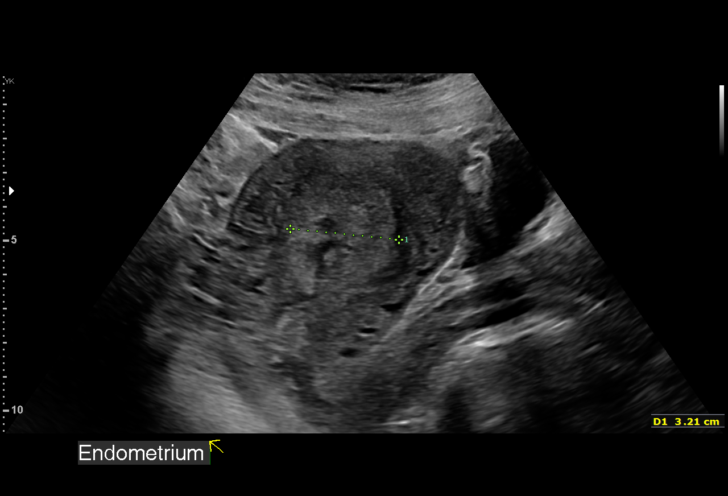
[im 42/54]
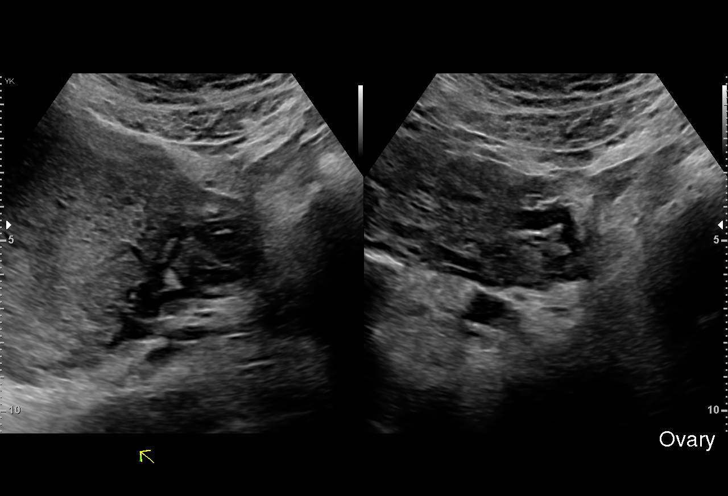
[im 46/54]
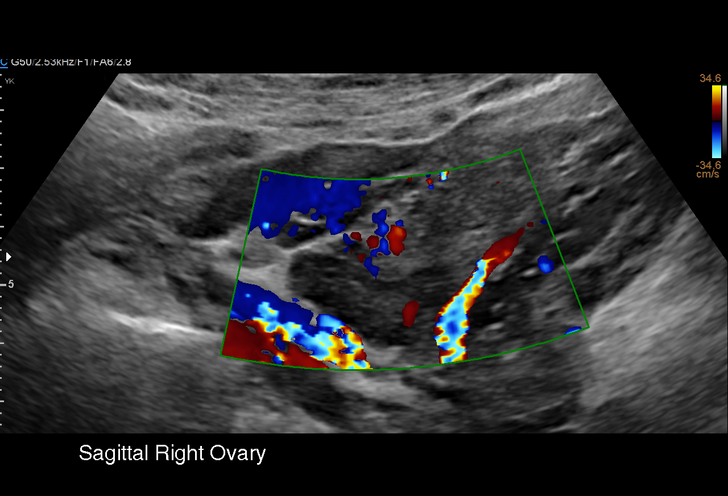
[im 50/54]
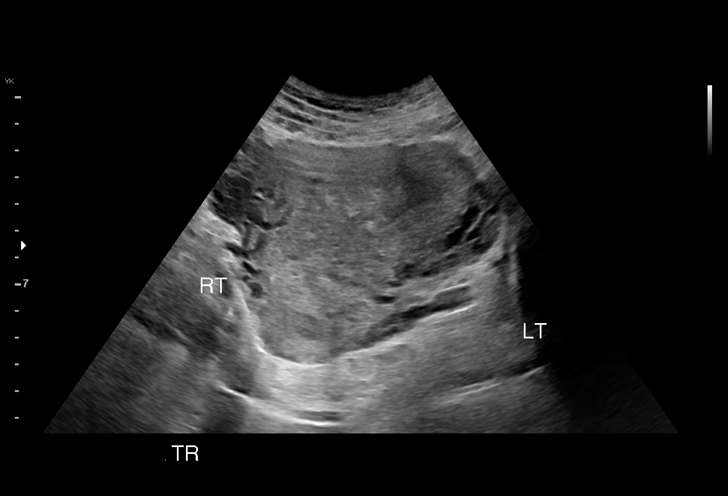
[im 54/54]
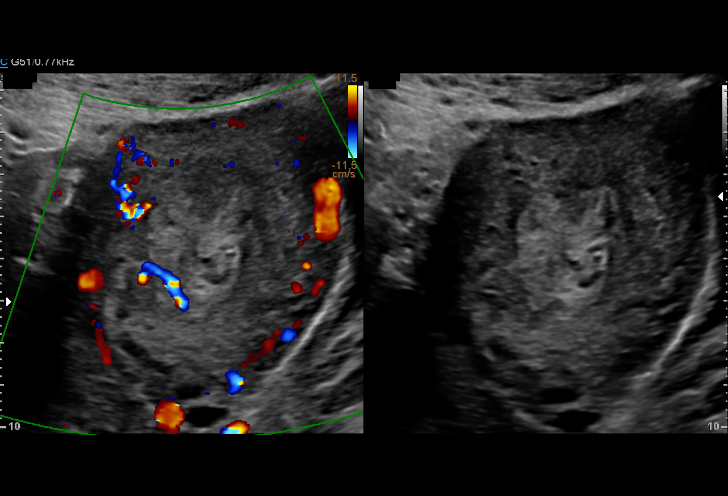

[15 of 28 positions shown; findings below may reference images not displayed]

FINDINGS: Intrauterine gestational sac: None

Maternal uterus/adnexae: The endometrium is thickened and
heterogeneous with small anechoic cystic spaces and
hypervascularity. No IUP noted. No gestational sac. The ovaries are
normal in appearance.
IMPRESSION: 1. An IUP is not identified. The endometrium is thickened and
heterogeneous containing small anechoic cystic spaces. The lack of
an IUP in the setting of a positive pregnancy test may represent
early pregnancy, recent miscarriage, or ectopic pregnancy. The
thickened heterogeneous endometrium could represent retained
products of conception after miscarriage in the appropriate clinical
setting. Recommend clinical correlation and close clinical
follow-up.
